# Patient Record
Sex: Female | Born: 1960 | Race: White | Hispanic: No | Marital: Single | Smoking: Never smoker
Health system: Southern US, Community
[De-identification: ages and names within clinical notes are randomized; demographics above are authoritative.]

## PROBLEM LIST (undated history)

## (undated) DIAGNOSIS — G43909 Migraine, unspecified, not intractable, without status migrainosus: Secondary | ICD-10-CM

## (undated) DIAGNOSIS — B019 Varicella without complication: Secondary | ICD-10-CM

## (undated) DIAGNOSIS — L661 Lichen planopilaris, unspecified: Secondary | ICD-10-CM

## (undated) DIAGNOSIS — N390 Urinary tract infection, site not specified: Secondary | ICD-10-CM

## (undated) HISTORY — DX: Lichen planopilaris: L66.1

## (undated) HISTORY — PX: TUBAL LIGATION: SHX77

## (undated) HISTORY — DX: Urinary tract infection, site not specified: N39.0

## (undated) HISTORY — DX: Lichen planopilaris, unspecified: L66.10

## (undated) HISTORY — PX: OTHER SURGICAL HISTORY: SHX169

## (undated) HISTORY — DX: Varicella without complication: B01.9

## (undated) HISTORY — PX: APPENDECTOMY: SHX54

---

## 1997-07-26 ENCOUNTER — Inpatient Hospital Stay (HOSPITAL_COMMUNITY): Admission: AD | Admit: 1997-07-26 | Discharge: 1997-07-29 | Payer: Self-pay | Admitting: Obstetrics and Gynecology

## 1998-09-13 ENCOUNTER — Other Ambulatory Visit: Admission: RE | Admit: 1998-09-13 | Discharge: 1998-09-13 | Payer: Self-pay | Admitting: Obstetrics and Gynecology

## 1999-10-03 ENCOUNTER — Other Ambulatory Visit: Admission: RE | Admit: 1999-10-03 | Discharge: 1999-10-03 | Payer: Self-pay | Admitting: Obstetrics and Gynecology

## 2000-06-04 ENCOUNTER — Inpatient Hospital Stay (HOSPITAL_COMMUNITY): Admission: AD | Admit: 2000-06-04 | Discharge: 2000-06-04 | Payer: Self-pay | Admitting: Obstetrics and Gynecology

## 2000-07-04 ENCOUNTER — Inpatient Hospital Stay (HOSPITAL_COMMUNITY): Admission: AD | Admit: 2000-07-04 | Discharge: 2000-07-04 | Payer: Self-pay | Admitting: Gynecology

## 2000-09-11 ENCOUNTER — Other Ambulatory Visit: Admission: RE | Admit: 2000-09-11 | Discharge: 2000-09-11 | Payer: Self-pay | Admitting: Obstetrics and Gynecology

## 2000-10-13 ENCOUNTER — Ambulatory Visit (HOSPITAL_COMMUNITY): Admission: RE | Admit: 2000-10-13 | Discharge: 2000-10-13 | Payer: Self-pay | Admitting: Obstetrics and Gynecology

## 2001-03-26 ENCOUNTER — Inpatient Hospital Stay (HOSPITAL_COMMUNITY): Admission: AD | Admit: 2001-03-26 | Discharge: 2001-03-28 | Payer: Self-pay | Admitting: Obstetrics and Gynecology

## 2001-04-03 ENCOUNTER — Encounter: Payer: Self-pay | Admitting: Obstetrics and Gynecology

## 2001-04-03 ENCOUNTER — Inpatient Hospital Stay (HOSPITAL_COMMUNITY): Admission: AD | Admit: 2001-04-03 | Discharge: 2001-04-03 | Payer: Self-pay | Admitting: Obstetrics and Gynecology

## 2001-04-07 ENCOUNTER — Encounter: Admission: RE | Admit: 2001-04-07 | Discharge: 2001-07-06 | Payer: Self-pay | Admitting: Internal Medicine

## 2001-05-05 ENCOUNTER — Other Ambulatory Visit: Admission: RE | Admit: 2001-05-05 | Discharge: 2001-05-05 | Payer: Self-pay | Admitting: Obstetrics and Gynecology

## 2001-09-06 ENCOUNTER — Emergency Department (HOSPITAL_COMMUNITY): Admission: EM | Admit: 2001-09-06 | Discharge: 2001-09-06 | Payer: Self-pay | Admitting: Emergency Medicine

## 2001-09-07 ENCOUNTER — Encounter: Payer: Self-pay | Admitting: Emergency Medicine

## 2002-05-24 ENCOUNTER — Other Ambulatory Visit: Admission: RE | Admit: 2002-05-24 | Discharge: 2002-05-24 | Payer: Self-pay | Admitting: Obstetrics and Gynecology

## 2002-07-12 ENCOUNTER — Encounter: Payer: Self-pay | Admitting: Internal Medicine

## 2002-07-12 ENCOUNTER — Inpatient Hospital Stay (HOSPITAL_COMMUNITY): Admission: EM | Admit: 2002-07-12 | Discharge: 2002-07-13 | Payer: Self-pay | Admitting: General Surgery

## 2002-07-12 ENCOUNTER — Encounter: Admission: RE | Admit: 2002-07-12 | Discharge: 2002-07-12 | Payer: Self-pay | Admitting: Internal Medicine

## 2002-07-12 ENCOUNTER — Encounter (INDEPENDENT_AMBULATORY_CARE_PROVIDER_SITE_OTHER): Payer: Self-pay | Admitting: Specialist

## 2003-01-21 ENCOUNTER — Ambulatory Visit (HOSPITAL_COMMUNITY): Admission: RE | Admit: 2003-01-21 | Discharge: 2003-01-21 | Payer: Self-pay | Admitting: Obstetrics and Gynecology

## 2003-06-01 ENCOUNTER — Other Ambulatory Visit: Admission: RE | Admit: 2003-06-01 | Discharge: 2003-06-01 | Payer: Self-pay | Admitting: Obstetrics and Gynecology

## 2004-07-04 ENCOUNTER — Other Ambulatory Visit: Admission: RE | Admit: 2004-07-04 | Discharge: 2004-07-04 | Payer: Self-pay | Admitting: Obstetrics and Gynecology

## 2005-01-18 ENCOUNTER — Encounter: Admission: RE | Admit: 2005-01-18 | Discharge: 2005-01-18 | Payer: Self-pay | Admitting: Internal Medicine

## 2005-03-06 ENCOUNTER — Encounter (INDEPENDENT_AMBULATORY_CARE_PROVIDER_SITE_OTHER): Payer: Self-pay | Admitting: Specialist

## 2005-03-06 ENCOUNTER — Ambulatory Visit (HOSPITAL_COMMUNITY): Admission: RE | Admit: 2005-03-06 | Discharge: 2005-03-06 | Payer: Self-pay | Admitting: Obstetrics and Gynecology

## 2008-01-01 ENCOUNTER — Encounter: Admission: RE | Admit: 2008-01-01 | Discharge: 2008-01-01 | Payer: Self-pay | Admitting: Internal Medicine

## 2008-07-18 ENCOUNTER — Encounter: Admission: RE | Admit: 2008-07-18 | Discharge: 2008-07-18 | Payer: Self-pay | Admitting: Internal Medicine

## 2010-05-07 ENCOUNTER — Other Ambulatory Visit: Payer: Self-pay | Admitting: Dermatology

## 2010-08-03 NOTE — H&P (Signed)
Alejandra Bowers, Alejandra Bowers                 ACCOUNT NO.:  [id number]   MEDICAL RECORD NO.:  [id number]          PATIENT TYPE:  AMB   LOCATION:  SDC                           FACILITY:  [facility]   PHYSICIAN:  Duke Salvia. Marcelle Overlie, M.D.DATE OF BIRTH:  [birth year]   DATE OF ADMISSION:  03/06/2005  DATE OF DISCHARGE:                                HISTORY & PHYSICAL   CHIEF COMPLAINT:  Menorrhagia.   HISTORY OF PRESENT ILLNESS:  A 50 year old, G3 P2, prior tubal, has had  significant problems with abnormal and heavy menstrual bleeding.  She  underwent a sonohysterogram, dated November 12, 2004, that showed adnexa  negative, no intracavitary mass noted.  There was one small _synechiae_  noted.  She presents now for [facility] with NovaSure EMA.  This procedure  including risk of bleeding, infection, adjacent organ injury, other  complications that may necessitate other surgery all reviewed with her, plus  the data for hypomenorrhea and amenorrhea on NovaSure review with her.   PAST MEDICAL HISTORY:   ALLERGIES:  1.  ERYTHROMYCIN.  2.  MACROBID.  3.  FLOXIN.  Marland Kitchen   CURRENT MEDICATIONS:  Imitrex p.r.n.   She has had a prior tubal ligation.   OBSTETRICAL HISTORY:  She has had two vaginal deliveries at term and  requires Clomid for ovulation induction.   FAMILY HISTORY:  Significant for mother who had hypothyroidism.  Otherwise  family history unremarkable.   PHYSICAL EXAMINATION:  VITAL SIGNS:  Temp 98.2, blood pressure 120/68.  HEENT:  Unremarkable.  NECK:  Supple without masses.  LUNGS:  Clear.  CARDIOVASCULAR:  Regular rate and rhythm without murmurs, rubs, or gallops  noted.  BREASTS:  Without masses.  ABDOMEN:  Soft, flat, nontender.  PELVIC:  Normal external genitalia.  Vagina cervix clear.  Uterus upper  limit of normal size.  There was mild prolapse noted.  Adnexa negative.  EXTREMITIES:  Unremarkable.  NEUROLOGIC:  Unremarkable.   IMPRESSION:  Menorrhagia.   PLAN:  D&C, NovaSure EMS.   Procedure and risks were reviewed as above.      Richard M. Marcelle Overlie, M.D.  Electronically Signed     RMH/MEDQ  D:  03/06/2005  T:  03/06/2005  Job:  [id number]

## 2010-08-03 NOTE — H&P (Signed)
   [name], Alejandra Bowers                           ACCOUNT NO.:  [id number]   MEDICAL RECORD NO.:  [id number]                   PATIENT TYPE:  AMB   LOCATION:  SDC                                  [facility]ILITY[facility]  [facility]   PHYSICIAN:  Alejandra Bowers, M.D.            DATE OF BIRTH:  Mar 13, 1961   DATE OF ADMISSION:  01/21/2003  DATE OF DISCHARGE:                                HISTORY & PHYSICAL   CHIEF COMPLAINT:  Requests permanent sterilization.   HISTORY OF PRESENT ILLNESS:  [age]-year-old G3 P2, currently on  Loestrin for contraception presents for tubal ligation.  This procedure  including risk of bleeding, infection, adjacent organ injury, the possible  need for open or additional surgery reviewed with her.  The permanence of  the procedure and failure rate of 2 to 3 per 1000 discussed also.  She  remains firm in her decision to proceed with sterilization.   PAST MEDICAL HISTORY:   ALLERGIES:  1. ERYTHROMYCIN.  2. MACROBID.  3. FLOXIN.  4. EGGS.   OPERATIONS:  D&C with removal of endometrial polyps; two vaginal deliveries.   FAMILY HISTORY:  Mother had a thyroidectomy, currently on Synthroid.   CURRENT MEDICATION:  Loestrin.   PHYSICAL EXAMINATION:  VITAL SIGNS:  Temperature 98.2, blood pressure  130/60.  HEENT:  Unremarkable.  NECK:  Supple without masses.  LUNGS:  Clear.  CARDIOVASCULAR:  Regular rate and rhythm without murmurs, rubs, gallops  noted.  BREASTS:  Without masses.  ABDOMEN:  Soft, flat, nontender.  PELVIC:  Normal external genitalia.  Vagina and cervix clear.  Uterus  midposition, normal size.  Adnexa negative.  EXTREMITIES/NEUROLOGIC:  Exams unremarkable.   IMPRESSION:  For tubal ligation by Filshie clip application.  Procedure and  risk reviewed as above.   PLAN:  Tubal ligation by Filshie clip application scheduled as an  outpatient.                                               Alejandra Bowers, M.D.   RMH/MEDQ  D:  01/11/2003  T:   01/11/2003  Job:  [id number]

## 2010-08-03 NOTE — Op Note (Signed)
NAMECHARLIEGH, Alejandra Bowers                           ACCOUNT NO.:  [id number]   MEDICAL RECORD NO.:  [id number]                   PATIENT TYPE:  INP   LOCATION:  0447                                 FACILITY:  [facility]   PHYSICIAN:  Sharlet Salina T. Hoxworth, M.D.          DATE OF BIRTH:  10/22/1960   DATE OF PROCEDURE:  07/12/2002  DATE OF DISCHARGE:                                 OPERATIVE REPORT   PREOPERATIVE DIAGNOSIS:  Acute appendicitis.   POSTOPERATIVE DIAGNOSIS:  Acute appendicitis.   SURGICAL PROCEDURE:  Laparoscopic appendectomy.   SURGEON:  Lorne Skeens. Hoxworth, M.D.   ANESTHESIA:  General.   BRIEF HISTORY:  The patient is a 50 year old white female who presents with  two days of progressive but diffuse mid right lower quadrant abdominal pain,  and has localized tenderness in the right lower quadrant.  A CT scan was  obtained today, which shows changes consistent with acute appendicitis.  Laparoscopic appendectomy has been recommended and accepted.   The nature of the procedure, its indications, risks of bleeding and  infection were discussed and approved preoperatively.  She now presents to  the operating room for this procedure.   DESCRIPTION OF PROCEDURE:  The patient went to operating room and placed in  supine position on the operating room table.  General endotracheal  anesthesia was induced.  The abdomen was sterilely prepped and draped.  She  had received preoperative antibiotics.  Local anesthesia was used to inject  the trocar sites prior to the incisions.  A 1 cm incision made at the  umbilicus and dissection carried down to the midline fascia, which was  sharply incised from 1 cm.  The peritoneum was entered under direct vision.  A pair of mattress sutures with 0 Vicryl made; the Hasson trocar was placed  and pneumoperitoneum established.  Under direct vision, a 5 mm trocar was  placed in the right upper quadrant, and a 12 mm trocar placed in the left  lower  quadrant.  The appendix was visualized, lying just inferior to the  cecum; showed evidence of acute inflammation and thickening, though no  gangrene, perforation or obvious purulence.  The appendix was elevated and  lateral peritoneal attachments divided, freeing the appendix and tip of the  cecum.  The mesoappendix was isolated and was divided with the harmonic  scalpel, completely freeing the appendix down to its base.  The appendix was  then divided with the firing of the Endo GIA 3.5 mm stapler.  The appendix  was placed in the Endo catch bag and brought out through the umbilicus.  There was some slight bleeding along the staple line, controlled with two  clips.  The right lower quadrant was irrigated, suctioned and hemostasis was  assured.  Trocars were removed under direct vision.  All CO2 evacuated from  the peritoneal cavity.  The mattress suture was secured at the umbilicus.  Skin incisions were  closed with interrupted subcuticular 4-0 Monocryl and  Steri-Strips.  Sponge, needle and instrument counts were correct.  Dry  sterile dressings were applied and the patient taken to the recovery room in  good condition.                                               Lorne Skeens. Hoxworth, M.D.   Tory Emerald  D:  07/12/2002  T:  07/13/2002  Job:  [id number]

## 2010-08-03 NOTE — Op Note (Signed)
Alejandra Bowers, Alejandra Bowers                           ACCOUNT NO.:  [id number]   MEDICAL RECORD NO.:  [id number]                   PATIENT TYPE:  AMB   LOCATION:  SDC                                  [facility]ACILITY:  [facility]   PHYSICIAN:  Duke Salvia. Marcelle Overlie, M.D.            DATE OF BIRTH:  1960/09/17   DATE OF PROCEDURE:  01/21/2003  DATE OF DISCHARGE:                                 OPERATIVE REPORT   PREOPERATIVE DIAGNOSIS:  Requests permanent sterilization.   POSTOPERATIVE DIAGNOSIS:  Requests permanent sterilization.   PROCEDURE:  Laparoscopic tubal ligation by Filshie clip application.   SURGEON:  Duke Salvia. Marcelle Overlie, M.D.   ANESTHESIA:  General endotracheal.   COMPLICATIONS:  None.   DRAINS:  In-and-out Foley catheter.   ESTIMATED BLOOD LOSS:  Less than 5 mL.   PROCEDURE AND FINDINGS:  The patient had been taken to the operating room.  After an adequate level of general endotracheal anesthesia was obtained with  the patient's legs in stirrups, the abdomen, perineum, and vagina were  prepped and draped in the usual manner for a laparoscopy.  The bladder was  drained, a UA carried out, and the uterus was anterior, small, mobile,  adnexa negative.  A Hulka tenaculum was positioned.  Attention directed to  the abdomen, where the subumbilical area was infiltrated with 0.5% Marcaine  plain.  A small incision was made and the Veress needle was introduced  without difficulty.  Its intra-abdominal position was verified by pressure  and water testing.  After a 2.5 L pneumoperitoneum was then created, a  laparoscopic trocar and sleeve were then introduced without difficulty.  There was no evidence of any bleeding or trauma.  With the patient in  Trendelenburg and the uterus anteflexed, pelvic findings were observed and  noted to be normal.  A solitary surgical staple was agglutinated to the  surface of the left ovary, not causing any adhesions.  Presumably this had  migrated after her  appendectomy.  No other abnormalities noted.  Marcaine  0.5% plain 3-4 mL were then dripped across each tube from the cornu to the  fimbriated end.  The Filshie clip applicator was then backloaded, the  Filshie clip was applied at a right angle 2 cm from the cornu on either  side, completely engulfing the tube at a right angle with excellent  application.  This was repeated on both sides after carefully identifying  the tubes.  She tolerated this well.  Instruments were removed, gas was  allowed to escape.  The defects closed with 4-0 Dexon subcuticular sutures,  and she went to the recovery room in good condition.                                               Richard M.  Marcelle Overlie, M.D.    RMH/MEDQ  D:  01/21/2003  T:  01/21/2003  Job:  [id number]

## 2010-08-03 NOTE — Op Note (Signed)
NAMEALLYNA, PITTSLEY                 ACCOUNT NO.:  [id number]   MEDICAL RECORD NO.:  [id number]          PATIENT TYPE:  AMB   LOCATION:  SDC                           FACILITY:  [facility]   PHYSICIAN:  Duke Salvia. Marcelle Overlie, M.D.DATE OF BIRTH:  01/24/1961   DATE OF PROCEDURE:  03/06/2005  DATE OF DISCHARGE:                                 OPERATIVE REPORT   PREOPERATIVE DIAGNOSIS:  Menorrhagia.   POSTOPERATIVE DIAGNOSIS:  Menorrhagia.   PROCEDURE:  D&C, NovaSure EMA.   SURGEON:  Duke Salvia. Marcelle Overlie, M.D.   ANESTHESIA:  General.   COMPLICATIONS:  None.   DRAINS:  In-and-out Foley catheter.   BLOOD LOSS:  Minimal.   SPECIMEN:  Endometrial curettings.   DESCRIPTION OF PROCEDURE:  The patient was taken to the operating room and  after an adequate level of general anesthesia was obtained with the  patient's legs in stirrups. The perineum and vagina prepped and draped in  usual manner for vaginal procedure. Bladder was drained, EUA carried out.  Uterus was posterior. There was mild degree of prolapse. The uterus  otherwise normal size. Adnexa negative. Speculum was positioned,  paracervical block created by infiltrating at 3 and 9 o'clock submucosally 5  to 7 mL of 1% Xylocaine on either side after negative aspiration. The uterus  was then sounded to 11 cm with the cervical length of 3.5 and was dilated to  a 27-29 Pratt dilator. A D&C was carried out to mainly send endometrial  biopsy. Minimal tissue was removed. The NovaSure was inserted per protocol  with a sounding length of 11, cervical length 3.5. Passed the CO2 test and  power of 161 for 43 seconds without any immediate complication. She  tolerated this well and went to recovery room in good condition.      Richard M. Marcelle Overlie, M.D.  Electronically Signed     RMH/MEDQ  D:  03/06/2005  T:  03/07/2005  Job:  [id number]

## 2010-08-03 NOTE — H&P (Signed)
Alejandra Bowers, Alejandra Bowers                           ACCOUNT NO.:  [id number]   MEDICAL RECORD NO.:  [id number]                   PATIENT TYPE:  INP   LOCATION:  0447                                 FACILITY:  [facility]   PHYSICIAN:  Sharlet Salina T. Hoxworth, M.D.          DATE OF BIRTH:  1960/04/29   DATE OF ADMISSION:  07/12/2002  DATE OF DISCHARGE:                                HISTORY & PHYSICAL   CHIEF COMPLAINT:  Right lower quadrant abdominal pain.   HISTORY OF PRESENT ILLNESS:  The patient is a 50 year old white female who  about 48 hours ago developed initially diffuse mild to moderate abdominal  pain.  Over the past 24 hours it has become more severe and localized to the  right lower quadrant.  She states that it is worse with any motion and is  better with rest.  She has not had any fever, chills, nausea or vomiting.  Bowel movement yesterday was normal.  No GU symptoms.  She was evaluated  yesterday at the [facility] and did have a mildly elevated white  count.  She was stressed to return today for followup, and there was concern  over significant right lower quadrant tenderness and a CT scan of the  abdomen was obtained.  She denies any previous significant chronic GI  complaints or abdominal pain.   PAST MEDICAL HISTORY:  D&C.  Otherwise, entirely negative for medical and  surgical illnesses.   MEDICATIONS:  She is on no current medications.   ALLERGIES:  1. MACROBID.  2. FLOXIN.   SOCIAL HISTORY:  Married, two children.  Does not smoke cigarettes or drink  alcohol.   FAMILY HISTORY:  Noncontributory.   REVIEW OF SYMPTOMS:  GENERAL:  No fever, chills, weight change.  RESPIRATORY:  No wheezing, shortness of breath, or cough.  CARDIAC:  No  palpitations or heart murmur.  ABDOMEN:  As above.  GENITOURINARY:  No  frequency, burning, blood.  HEMATOLOGIC:  No history of blood clots or  abnormal bleeding.   PHYSICAL EXAMINATION:  VITAL SIGNS:  Temperature is 98,  pulse is 86 and  regular, respirations 20, blood pressure 108/70.  GENERAL:  She is a well-developed white female in no acute distress.  SKIN:  Warm and dry without rash or infection.  HEENT:  No palpable thyromegaly or masses.  Sclerae nonicteric.  LUNGS:  Clear to auscultation.  CARDIAC:  Regular rate and rhythm without murmurs.  No JVD or edema.  ABDOMEN:  Bowel sounds are present.  There is very significant well  localized right lower quadrant tenderness with guarding.  No palpable masses  or hepatosplenomegaly.  EXTREMITIES:  Without deformity or edema.  NEUROLOGIC:  Alert and oriented x4.  Motor and sensory examination grossly  normal.   LABORATORY DATA:  Obtained from [facility] shows urinalysis with  2+ leukocytes.  Liver function tests and amylase normal.  Hemoglobin 14.3,  white blood cell count 6.8.   CT scan of the abdomen was performed today at [facility], and shows thickening of  the appendix consistent with acute appendicitis without complication.   ASSESSMENT AND PLAN:  Right lower quadrant abdominal pain, very significant  tenderness on examination, and CT scan with evidence of acute appendicitis.  I have recommended proceeding with laparoscopic appendectomy, and the  patient is admitted for this procedure.                                                Lorne Skeens. Hoxworth, M.D.    Tory Emerald  D:  07/12/2002  T:  07/13/2002  Job:  [id number]

## 2011-03-22 ENCOUNTER — Ambulatory Visit: Payer: Self-pay

## 2012-06-08 ENCOUNTER — Other Ambulatory Visit: Payer: Self-pay | Admitting: Internal Medicine

## 2012-06-08 DIAGNOSIS — R1011 Right upper quadrant pain: Secondary | ICD-10-CM

## 2012-06-09 ENCOUNTER — Ambulatory Visit
Admission: RE | Admit: 2012-06-09 | Discharge: 2012-06-09 | Disposition: A | Discharge status: Home / Self Care | Payer: Managed Care, Other (non HMO) | Source: Ambulatory Visit | Attending: Internal Medicine | Admitting: Internal Medicine

## 2012-06-09 DIAGNOSIS — R1011 Right upper quadrant pain: Secondary | ICD-10-CM

## 2013-11-15 ENCOUNTER — Other Ambulatory Visit: Payer: Self-pay | Admitting: Obstetrics and Gynecology

## 2013-11-16 LAB — CYTOLOGY - PAP

## 2014-11-23 ENCOUNTER — Other Ambulatory Visit: Payer: Self-pay | Admitting: Obstetrics and Gynecology

## 2014-11-24 LAB — CYTOLOGY - PAP

## 2016-04-19 ENCOUNTER — Ambulatory Visit (INDEPENDENT_AMBULATORY_CARE_PROVIDER_SITE_OTHER): Payer: BLUE CROSS/BLUE SHIELD | Admitting: Podiatry

## 2016-04-19 ENCOUNTER — Ambulatory Visit (INDEPENDENT_AMBULATORY_CARE_PROVIDER_SITE_OTHER): Payer: BLUE CROSS/BLUE SHIELD

## 2016-04-19 ENCOUNTER — Encounter: Payer: Self-pay | Admitting: Podiatry

## 2016-04-19 VITALS — Resp 16 | Ht 62.0 in | Wt 180.0 lb

## 2016-04-19 DIAGNOSIS — M722 Plantar fascial fibromatosis: Secondary | ICD-10-CM | POA: Diagnosis not present

## 2016-04-19 MED ORDER — TRIAMCINOLONE ACETONIDE 10 MG/ML IJ SUSP
10.0000 mg | Freq: Once | INTRAMUSCULAR | Status: AC
  Administered 2016-04-19: 10 mg

## 2016-04-19 MED ORDER — DICLOFENAC SODIUM 75 MG PO TBEC: 75.0000 mg | DELAYED_RELEASE_TABLET | Freq: Two times a day (BID) | ORAL | 2 refills | Status: DC

## 2016-04-19 NOTE — Progress Notes (Signed)
   Subjective:    Patient ID: Alejandra Bowers, female    DOB: 30-Sep-1960, 56 y.o.   MRN: [id number]  HPI  Chief Complaint  Patient presents with  . Foot Pain    Left; Bottom of heel and lateral side x 1 year but has got worse over the past month.        Review of Systems     Objective:   Physical Exam        Assessment & Plan:

## 2016-04-19 NOTE — Patient Instructions (Signed)

## 2016-04-20 NOTE — Progress Notes (Signed)
Subjective:     Patient ID: Alejandra Bowers, female   DOB: [birth year], 56 y.o.   MRN: [id number]  HPI patient states that she has a lot of pain in her left heel and it's making it difficult for her to walk and it's been present for at least 3 months and she's tried wider shoes and over-the-counter insoles   Review of Systems  All other systems reviewed and are negative.      Objective:   Physical Exam  Constitutional: She is oriented to person, place, and time.  Cardiovascular: Intact distal pulses.   Musculoskeletal: Normal range of motion.  Neurological: She is oriented to person, place, and time.  Skin: Skin is warm.  Nursing note and vitals reviewed.  neurovascular status intact muscle strength adequate range of motion within normal limits with patient found to have exquisite discomfort plantar aspect left heel the insertional point tendon the calcaneus with inflammation fluid buildup around the medial band with moderate depression of the arch     Assessment:     Severe plantar fasciitis left heel acute in nature    Plan:     H&P condition reviewed and injection of the plantar fascial left accomplished 3 mg Kenalog 5 mill grams Xylocaine and applied fascial brace placed on diclofenac 75 mg twice a day and gave instructions for possible long-term orthotics. Patient will utilize physical therapy and be seen back in several weeks  X-rays indicate spur formation with no indications of stress fracture

## 2016-05-03 ENCOUNTER — Ambulatory Visit: Payer: BLUE CROSS/BLUE SHIELD | Admitting: Podiatry

## 2016-05-08 ENCOUNTER — Encounter: Payer: Self-pay | Admitting: Podiatry

## 2016-05-08 ENCOUNTER — Ambulatory Visit (INDEPENDENT_AMBULATORY_CARE_PROVIDER_SITE_OTHER): Payer: BLUE CROSS/BLUE SHIELD | Admitting: Podiatry

## 2016-05-08 DIAGNOSIS — M722 Plantar fascial fibromatosis: Secondary | ICD-10-CM | POA: Diagnosis not present

## 2016-05-08 NOTE — Progress Notes (Signed)
Subjective:     Patient ID: Galen Manilaammy S Kitamura, female   DOB: 1960-06-14, 56 y.o.   MRN: [id number]  HPI patient presents stating I'm still getting discomfort in my left heel and it's making it hard for me to walk comfortably. Patient states it some better than previous but still present   Review of Systems     Objective:   Physical Exam Neurovascular status intact with discomfort plantar heel left with continued depression of the arch upon weightbearing with mechanical dysfunction noted    Assessment:     Plantar fasciitis related to structure of the arch creating problems    Plan:     H&P condition reviewed and recommended long-term orthotics to control motion and scanned for customized orthotic devices. I then went ahead and discussed continued anti-inflammatory activity and will be seen back when orthotics ready or earlier if needed and discussed physical therapy and shoe gear modifications

## 2016-06-05 ENCOUNTER — Ambulatory Visit: Payer: BLUE CROSS/BLUE SHIELD

## 2016-06-06 ENCOUNTER — Ambulatory Visit (INDEPENDENT_AMBULATORY_CARE_PROVIDER_SITE_OTHER): Payer: Self-pay | Admitting: Podiatry

## 2016-06-06 DIAGNOSIS — M722 Plantar fascial fibromatosis: Secondary | ICD-10-CM

## 2016-06-06 NOTE — Patient Instructions (Signed)

## 2016-06-06 NOTE — Progress Notes (Signed)
Patient presents for orthotic pick up.  Verbal and written break in and wear instructions given.  Patient will follow up in 4 weeks if symptoms worsen or fail to improve. 

## 2017-12-01 ENCOUNTER — Other Ambulatory Visit: Payer: Self-pay

## 2017-12-01 ENCOUNTER — Encounter: Payer: Self-pay | Admitting: Emergency Medicine

## 2017-12-01 ENCOUNTER — Ambulatory Visit
Admission: EM | Admit: 2017-12-01 | Discharge: 2017-12-01 | Disposition: A | Discharge status: Home / Self Care | Payer: PRIVATE HEALTH INSURANCE | Attending: Family Medicine | Admitting: Family Medicine

## 2017-12-01 DIAGNOSIS — N390 Urinary tract infection, site not specified: Secondary | ICD-10-CM | POA: Diagnosis not present

## 2017-12-01 DIAGNOSIS — R319 Hematuria, unspecified: Secondary | ICD-10-CM | POA: Diagnosis not present

## 2017-12-01 HISTORY — DX: Migraine, unspecified, not intractable, without status migrainosus: G43.909

## 2017-12-01 LAB — URINALYSIS, COMPLETE (UACMP) WITH MICROSCOPIC
BILIRUBIN URINE: NEGATIVE
Glucose, UA: NEGATIVE mg/dL
KETONES UR: NEGATIVE mg/dL
Nitrite: NEGATIVE
PH: 6.5 (ref 5.0–8.0)
Protein, ur: NEGATIVE mg/dL
Specific Gravity, Urine: 1.01 (ref 1.005–1.030)

## 2017-12-01 MED ORDER — CEPHALEXIN 500 MG PO CAPS: 500.0000 mg | ORAL_CAPSULE | Freq: Two times a day (BID) | ORAL | 0 refills | Status: DC

## 2017-12-01 NOTE — ED Triage Notes (Signed)
Pt c/o urinary urgency, pressure/ pelvic pain, and.urine odor. Started about about a week ago.

## 2017-12-01 NOTE — ED Provider Notes (Signed)
[facility]    CSN: [id number] Arrival date & time: 12/01/17  1833     History   Chief Complaint Chief Complaint  Patient presents with  . Urinary Tract Infection    HPI Alejandra Bowers is a 57 y.o. female.   The history is provided by the patient.  Urinary Tract Infection  Associated symptoms: no abdominal pain, no fever, no flank pain, no genital lesions, no nausea, no vaginal discharge and no vomiting   Dysuria  Pain quality:  Aching and burning Pain severity:  Moderate Onset quality:  Sudden Duration:  1 week Timing:  Constant Progression:  Worsening Chronicity:  New Recent urinary tract infections: no   Relieved by:  None tried Ineffective treatments:  None tried Urinary symptoms: foul-smelling urine and frequent urination   Associated symptoms: no abdominal pain, no fever, no flank pain, no genital lesions, no nausea, no vaginal discharge and no vomiting   Risk factors: no hx of pyelonephritis, no hx of urolithiasis, no kidney transplant, not pregnant, no recurrent urinary tract infections, no renal cysts, no renal disease, no single kidney and no urinary catheter     Past Medical History:  Diagnosis Date  . Migraines     There are no active problems to display for this patient.   Past Surgical History:  Procedure Laterality Date  . APPENDECTOMY    . TUBAL LIGATION      OB History   None      Home Medications    Prior to Admission medications   Medication Sig Start Date End Date Taking? Authorizing Provider  doxycycline (VIBRA-TABS) 100 MG tablet  11/15/17  Yes [provider]  finasteride (PROSCAR) 5 MG tablet TAKE 1/2 TO 1 TABLET BY MOUTH DAILY AS DIRECTED 04/10/16  Yes [provider]  meloxicam (MOBIC) 15 MG tablet  11/26/17  Yes [provider]  SUMAtriptan (IMITREX) 100 MG tablet TAKE 1 TABLET BY MOUTH AS NEEDED TWICE A DAY 02/06/16  Yes [provider]  cephALEXin (KEFLEX) 500 MG capsule Take 1  capsule (500 mg total) by mouth 2 (two) times daily. 12/01/17   Payton Mccallum, MD  diclofenac (VOLTAREN) 75 MG EC tablet Take 1 tablet (75 mg total) by mouth 2 (two) times daily. 04/19/16   Lenn Sink, DPM  FINACEA 15 % FOAM APPLY 1 GM ON THE SKIN TWICE A DAY 04/10/16   [provider]  zolpidem (AMBIEN) 5 MG tablet Take 5 mg by mouth at bedtime. 03/20/16   [provider]    Family History Family History  Problem Relation Age of Onset  . Rheum arthritis Mother   . Cancer Mother   . Cancer Father     Social History Social History   Tobacco Use  . Smoking status: Never Smoker  . Smokeless tobacco: Never Used  Substance Use Topics  . Alcohol use: Yes    Comment: 1 beer a month  . Drug use: Never     Allergies   Macrobid [nitrofurantoin]   Review of Systems Review of Systems  Constitutional: Negative for fever.  Gastrointestinal: Negative for abdominal pain, nausea and vomiting.  Genitourinary: Positive for dysuria. Negative for flank pain and vaginal discharge.     Physical Exam Triage Vital Signs ED Triage Vitals  Enc Vitals Group     BP 12/01/17 1851 127/84     Pulse Rate 12/01/17 1851 85     Resp 12/01/17 1851 17     Temp 12/01/17 1851  97.7 F (36.5 C)     Temp Source 12/01/17 1851 Oral     SpO2 12/01/17 1851 100 %     Weight 12/01/17 1849 190 lb (86.2 kg)     Height 12/01/17 1849 5\' 2"  (1.575 m)     Head Circumference --      Peak Flow --      Pain Score 12/01/17 1849 5     Pain Loc --      Pain Edu? --      Excl. in GC? --    No data found.  Updated Vital Signs BP 127/84 (BP Location: Left Arm)   Pulse 85   Temp 97.7 F (36.5 C) (Oral)   Resp 17   Ht 5\' 2"  (1.575 m)   Wt 86.2 kg   SpO2 100%   BMI 34.75 kg/m   Visual Acuity Right Eye Distance:   Left Eye Distance:   Bilateral Distance:    Right Eye Near:   Left Eye Near:    Bilateral Near:     Physical Exam  Constitutional: She appears well-developed and  well-nourished. No distress.  Abdominal: Soft. She exhibits no distension. There is no tenderness. There is no guarding.  Skin: She is not diaphoretic.  Nursing note and vitals reviewed.    UC Treatments / Results  Labs (all labs ordered are listed, but only abnormal results are displayed) Labs Reviewed  URINALYSIS, COMPLETE (UACMP) WITH MICROSCOPIC - Abnormal; Notable for the following components:      Result Value   Hgb urine dipstick SMALL (*)    Leukocytes, UA MODERATE (*)    Bacteria, UA MANY (*)    All other components within normal limits  URINE CULTURE    EKG None  Radiology No results found.  Procedures Procedures (including critical care time)  Medications Ordered in UC Medications - No data to display  Initial Impression / Assessment and Plan / UC Course  I have reviewed the triage vital signs and the nursing notes.  Pertinent labs & imaging results that were available during my care of the patient were reviewed by me and considered in my medical decision making (see chart for details).      Final Clinical Impressions(s) / UC Diagnoses   Final diagnoses:  Urinary tract infection with hematuria, site unspecified    ED Prescriptions    Medication Sig Dispense Auth. Provider   cephALEXin (KEFLEX) 500 MG capsule Take 1 capsule (500 mg total) by mouth 2 (two) times daily. 14 capsule January Bergthold, Pamala Hurryrlando, MD     1. lab results and diagnosis reviewed with patient 2. rx as per orders above; reviewed possible side effects, interactions, risks and benefits  3. Recommend supportive treatment with increased fluids  4. Follow-up prn if symptoms worsen or don't improve  Controlled Substance Prescriptions [place] Controlled Substance Registry consulted? Not Applicable   Payton Mccallumonty, Kitrina Maurin, MD 12/01/17 [phone]

## 2017-12-04 ENCOUNTER — Telehealth (HOSPITAL_COMMUNITY): Payer: Self-pay

## 2017-12-04 LAB — URINE CULTURE: Special Requests: NORMAL

## 2017-12-04 NOTE — Telephone Encounter (Signed)
Urine culture positive for kebsiella pneumoniae. This was treated with keflex at [facility] visit. Pt called and made aware.

## 2019-11-01 ENCOUNTER — Ambulatory Visit: Payer: PRIVATE HEALTH INSURANCE | Admitting: Nurse Practitioner

## 2019-11-01 ENCOUNTER — Encounter: Payer: Self-pay | Admitting: Nurse Practitioner

## 2019-11-01 ENCOUNTER — Other Ambulatory Visit: Payer: Self-pay

## 2019-11-01 VITALS — BP 106/70 | HR 80 | Temp 97.6°F | Ht 61.0 in | Wt 163.0 lb

## 2019-11-01 DIAGNOSIS — E785 Hyperlipidemia, unspecified: Secondary | ICD-10-CM | POA: Diagnosis not present

## 2019-11-01 DIAGNOSIS — E669 Obesity, unspecified: Secondary | ICD-10-CM | POA: Diagnosis not present

## 2019-11-01 DIAGNOSIS — E538 Deficiency of other specified B group vitamins: Secondary | ICD-10-CM | POA: Diagnosis not present

## 2019-11-01 DIAGNOSIS — Z0000 Encounter for general adult medical examination without abnormal findings: Secondary | ICD-10-CM | POA: Diagnosis not present

## 2019-11-01 DIAGNOSIS — G43709 Chronic migraine without aura, not intractable, without status migrainosus: Secondary | ICD-10-CM

## 2019-11-01 DIAGNOSIS — Z6830 Body mass index (BMI) 30.0-30.9, adult: Secondary | ICD-10-CM | POA: Diagnosis not present

## 2019-11-01 DIAGNOSIS — Z23 Encounter for immunization: Secondary | ICD-10-CM | POA: Diagnosis not present

## 2019-11-01 DIAGNOSIS — R7989 Other specified abnormal findings of blood chemistry: Secondary | ICD-10-CM | POA: Diagnosis not present

## 2019-11-01 LAB — CBC WITH DIFFERENTIAL/PLATELET
Basophils Absolute: 0 10*3/uL (ref 0.0–0.1)
Basophils Relative: 0.4 % (ref 0.0–3.0)
Eosinophils Absolute: 0.2 10*3/uL (ref 0.0–0.7)
Eosinophils Relative: 2.3 % (ref 0.0–5.0)
HCT: 43.7 % (ref 36.0–46.0)
Hemoglobin: 14.8 g/dL (ref 12.0–15.0)
Lymphocytes Relative: 30.2 % (ref 12.0–46.0)
Lymphs Abs: 2.1 10*3/uL (ref 0.7–4.0)
MCHC: 33.9 g/dL (ref 30.0–36.0)
MCV: 87.6 fl (ref 78.0–100.0)
Monocytes Absolute: 0.4 10*3/uL (ref 0.1–1.0)
Monocytes Relative: 5.6 % (ref 3.0–12.0)
Neutro Abs: 4.3 10*3/uL (ref 1.4–7.7)
Neutrophils Relative %: 61.5 % (ref 43.0–77.0)
Platelets: 222 10*3/uL (ref 150.0–400.0)
RBC: 4.99 Mil/uL (ref 3.87–5.11)
RDW: 14.9 % (ref 11.5–15.5)
WBC: 7 10*3/uL (ref 4.0–10.5)

## 2019-11-01 LAB — HEMOGLOBIN A1C: Hgb A1c MFr Bld: 5.6 % (ref 4.6–6.5)

## 2019-11-01 LAB — LIPID PANEL
Cholesterol: 237 mg/dL — ABNORMAL HIGH (ref 0–200)
HDL: 81.4 mg/dL (ref >39.00)
NonHDL: 155.94
Total CHOL/HDL Ratio: 3
Triglycerides: 274 mg/dL — ABNORMAL HIGH (ref 0.0–149.0)
VLDL: 54.8 mg/dL — ABNORMAL HIGH (ref 0.0–40.0)

## 2019-11-01 LAB — COMPREHENSIVE METABOLIC PANEL
ALT: 13 U/L (ref 0–35)
AST: 16 U/L (ref 0–37)
Albumin: 4.4 g/dL (ref 3.5–5.2)
Alkaline Phosphatase: 61 U/L (ref 39–117)
BUN: 12 mg/dL (ref 6–23)
CO2: 24 mEq/L (ref 19–32)
Calcium: 9.5 mg/dL (ref 8.4–10.5)
Chloride: 101 mEq/L (ref 96–112)
Creatinine, Ser: 0.72 mg/dL (ref 0.40–1.20)
GFR: 83.47 mL/min (ref >60.00)
Glucose, Bld: 85 mg/dL (ref 70–99)
Potassium: 3.8 mEq/L (ref 3.5–5.1)
Sodium: 136 mEq/L (ref 135–145)
Total Bilirubin: 0.5 mg/dL (ref 0.2–1.2)
Total Protein: 6.4 g/dL (ref 6.0–8.3)

## 2019-11-01 LAB — LDL CHOLESTEROL, DIRECT: Direct LDL: 114 mg/dL

## 2019-11-01 LAB — B12 AND FOLATE PANEL
Folate: 6.2 ng/mL (ref >5.9)
Vitamin B-12: 1244 pg/mL — ABNORMAL HIGH (ref 211–911)

## 2019-11-01 LAB — TSH: TSH: 2.25 u[IU]/mL (ref 0.35–4.50)

## 2019-11-01 LAB — VITAMIN D 25 HYDROXY (VIT D DEFICIENCY, FRACTURES): VITD: 34.64 ng/mL (ref 30.00–100.00)

## 2019-11-01 NOTE — Patient Instructions (Addendum)
Please go to the lab today.  We will call you when results are in.  Please sign medical release from Dr. Laurann Montana  Proceed with Dr. Matthew Saras  for her GYN care as you plan  Continue to follow with your dermatologist as needed.  Congratulations on your profound weight loss!  Continue with your healthy journey.  You were going to check on the last tetanus shot.   Begin shingles vaccine series she would like to have a today.     Migraine Headache A migraine headache is an intense, throbbing pain on one side or both sides of the head. Migraine headaches may also cause other symptoms, such as nausea, vomiting, and sensitivity to light and noise. A migraine headache can last from 4 hours to 3 days. Talk with your doctor about what things may bring on (trigger) your migraine headaches. What are the causes? The exact cause of this condition is not known. However, a migraine may be caused when nerves in the brain become irritated and release chemicals that cause inflammation of blood vessels. This inflammation causes pain. This condition may be triggered or caused by:  Drinking alcohol.  Smoking.  Taking medicines, such as: ? Medicine used to treat chest pain (nitroglycerin). ? Birth control pills. ? Estrogen. ? Certain blood pressure medicines.  Eating or drinking products that contain nitrates, glutamate, aspartame, or tyramine. Aged cheeses, chocolate, or caffeine may also be triggers.  Doing physical activity. Other things that may trigger a migraine headache include:  Menstruation.  Pregnancy.  Hunger.  Stress.  Lack of sleep or too much sleep.  Weather changes.  Fatigue. What increases the risk? The following factors may make you more likely to experience migraine headaches:  Being a certain age. This condition is more common in people who are 79-40 years old.  Being female.  Having a family history of migraine headaches.  Being Caucasian.  Having a mental  health condition, such as depression or anxiety.  Being obese. What are the signs or symptoms? The main symptom of this condition is pulsating or throbbing pain. This pain may:  Happen in any area of the head, such as on one side or both sides.  Interfere with daily activities.  Get worse with physical activity.  Get worse with exposure to bright lights or loud noises. Other symptoms may include:  Nausea.  Vomiting.  Dizziness.  General sensitivity to bright lights, loud noises, or smells. Before you get a migraine headache, you may get warning signs (an aura). An aura may include:  Seeing flashing lights or having blind spots.  Seeing bright spots, halos, or zigzag lines.  Having tunnel vision or blurred vision.  Having numbness or a tingling feeling.  Having trouble talking.  Having muscle weakness. Some people have symptoms after a migraine headache (postdromal phase), such as:  Feeling tired.  Difficulty concentrating. How is this diagnosed? A migraine headache can be diagnosed based on:  Your symptoms.  A physical exam.  Tests, such as: ? CT scan or an MRI of the head. These imaging tests can help rule out other causes of headaches. ? Taking fluid from the spine (lumbar puncture) and analyzing it (cerebrospinal fluid analysis, or CSF analysis). How is this treated? This condition may be treated with medicines that:  Relieve pain.  Relieve nausea.  Prevent migraine headaches. Treatment for this condition may also include:  Acupuncture.  Lifestyle changes like avoiding foods that trigger migraine headaches.  Biofeedback.  Cognitive behavioral therapy. Follow these  instructions at home: Medicines  Take over-the-counter and prescription medicines only as told by your health care provider.  Ask your health care provider if the medicine prescribed to you: ? Requires you to avoid driving or using heavy machinery. ? Can cause constipation. You  may need to take these actions to prevent or treat constipation:  Drink enough fluid to keep your urine pale yellow.  Take over-the-counter or prescription medicines.  Eat foods that are high in fiber, such as beans, whole grains, and fresh fruits and vegetables.  Limit foods that are high in fat and processed sugars, such as fried or sweet foods. Lifestyle  Do not drink alcohol.  Do not use any products that contain nicotine or tobacco, such as cigarettes, e-cigarettes, and chewing tobacco. If you need help quitting, ask your health care provider.  Get at least 8 hours of sleep every night.  Find ways to manage stress, such as meditation, deep breathing, or yoga. General instructions      Keep a journal to find out what may trigger your migraine headaches. For example, write down: ? What you eat and drink. ? How much sleep you get. ? Any change to your diet or medicines.  If you have a migraine headache: ? Avoid things that make your symptoms worse, such as bright lights. ? It may help to lie down in a dark, quiet room. ? Do not drive or use heavy machinery. ? Ask your health care provider what activities are safe for you while you are experiencing symptoms.  Keep all follow-up visits as told by your health care provider. This is important. Contact a health care provider if:  You develop symptoms that are different or more severe than your usual migraine headache symptoms.  You have more than 15 headache days in one month. Get help right away if:  Your migraine headache becomes severe.  Your migraine headache lasts longer than 72 hours.  You have a fever.  You have a stiff neck.  You have vision loss.  Your muscles feel weak or like you cannot control them.  You start to lose your balance often.  You have trouble walking.  You faint.  You have a seizure. Summary  A migraine headache is an intense, throbbing pain on one side or both sides of the head.  Migraines may also cause other symptoms, such as nausea, vomiting, and sensitivity to light and noise.  This condition may be treated with medicines and lifestyle changes. You may also need to avoid certain things that trigger a migraine headache.  Keep a journal to find out what may trigger your migraine headaches.  Contact your health care provider if you have more than 15 headache days in a month or you develop symptoms that are different or more severe than your usual migraine headache symptoms. This information is not intended to replace advice given to you by your health care provider. Make sure you discuss any questions you have with your health care provider. Document Revised: 06/26/2018 Document Reviewed: 04/16/2018 Elsevier Patient Education  2020 [employer]  Preventive Care 60-45 Years Old, Female Preventive care refers to visits with your health care provider and lifestyle choices that can promote health and wellness. This includes:  A yearly physical exam. This may also be called an annual well check.  Regular dental visits and eye exams.  Immunizations.  Screening for certain conditions.  Healthy lifestyle choices, such as eating a healthy diet, getting regular exercise, not using drugs  or products that contain nicotine and tobacco, and limiting alcohol use. What can I expect for my preventive care visit? Physical exam Your health care provider will check your:  Height and weight. This may be used to calculate body mass index (BMI), which tells if you are at a healthy weight.  Heart rate and blood pressure.  Skin for abnormal spots. Counseling Your health care provider may ask you questions about your:  Alcohol, tobacco, and drug use.  Emotional well-being.  Home and relationship well-being.  Sexual activity.  Eating habits.  Work and work Statistician.  Method of birth control.  Menstrual cycle.  Pregnancy history. What immunizations do I  need?  Influenza (flu) vaccine  This is recommended every year. Tetanus, diphtheria, and pertussis (Tdap) vaccine  You may need a Td booster every 10 years. Varicella (chickenpox) vaccine  You may need this if you have not been vaccinated. Zoster (shingles) vaccine  You may need this after age 65. Measles, mumps, and rubella (MMR) vaccine  You may need at least one dose of MMR if you were born in 1957 or later. You may also need a second dose. Pneumococcal conjugate (PCV13) vaccine  You may need this if you have certain conditions and were not previously vaccinated. Pneumococcal polysaccharide (PPSV23) vaccine  You may need one or two doses if you smoke cigarettes or if you have certain conditions. Meningococcal conjugate (MenACWY) vaccine  You may need this if you have certain conditions. Hepatitis A vaccine  You may need this if you have certain conditions or if you travel or work in places where you may be exposed to hepatitis A. Hepatitis B vaccine  You may need this if you have certain conditions or if you travel or work in places where you may be exposed to hepatitis B. Haemophilus influenzae type b (Hib) vaccine  You may need this if you have certain conditions. Human papillomavirus (HPV) vaccine  If recommended by your health care provider, you may need three doses over 6 months. You may receive vaccines as individual doses or as more than one vaccine together in one shot (combination vaccines). Talk with your health care provider about the risks and benefits of combination vaccines. What tests do I need? Blood tests  Lipid and cholesterol levels. These may be checked every 5 years, or more frequently if you are over 3 years old.  Hepatitis C test.  Hepatitis B test. Screening  Lung cancer screening. You may have this screening every year starting at age 4 if you have a 30-pack-year history of smoking and currently smoke or have quit within the past 15  years.  Colorectal cancer screening. All adults should have this screening starting at age 20 and continuing until age 15. Your health care provider may recommend screening at age 100 if you are at increased risk. You will have tests every 1-10 years, depending on your results and the type of screening test.  Diabetes screening. This is done by checking your blood sugar (glucose) after you have not eaten for a while (fasting). You may have this done every 1-3 years.  Mammogram. This may be done every 1-2 years. Talk with your health care provider about when you should start having regular mammograms. This may depend on whether you have a family history of breast cancer.  BRCA-related cancer screening. This may be done if you have a family history of breast, ovarian, tubal, or peritoneal cancers.  Pelvic exam and Pap test. This may be  done every 3 years starting at age 30. Starting at age 60, this may be done every 5 years if you have a Pap test in combination with an HPV test. Other tests  Sexually transmitted disease (STD) testing.  Bone density scan. This is done to screen for osteoporosis. You may have this scan if you are at high risk for osteoporosis. Follow these instructions at home: Eating and drinking  Eat a diet that includes fresh fruits and vegetables, whole grains, lean protein, and low-fat dairy.  Take vitamin and mineral supplements as recommended by your health care provider.  Do not drink alcohol if: ? Your health care provider tells you not to drink. ? You are pregnant, may be pregnant, or are planning to become pregnant.  If you drink alcohol: ? Limit how much you have to 0-1 drink a day. ? Be aware of how much alcohol is in your drink. In the [place] one drink equals one 12 oz bottle of beer (355 mL), one 5 oz glass of wine (148 mL), or one 1 oz glass of hard liquor (44 mL). Lifestyle  Take daily care of your teeth and gums.  Stay active. Exercise for at least 30  minutes on 5 or more days each week.  Do not use any products that contain nicotine or tobacco, such as cigarettes, e-cigarettes, and chewing tobacco. If you need help quitting, ask your health care provider.  If you are sexually active, practice safe sex. Use a condom or other form of birth control (contraception) in order to prevent pregnancy and STIs (sexually transmitted infections).  If told by your health care provider, take low-dose aspirin daily starting at age 79. What's next?  Visit your health care provider once a year for a well check visit.  Ask your health care provider how often you should have your eyes and teeth checked.  Stay up to date on all vaccines. This information is not intended to replace advice given to you by your health care provider. Make sure you discuss any questions you have with your health care provider. Document Revised: 11/13/2017 Document Reviewed: 11/13/2017 Elsevier Patient Education  2020 [employer].

## 2019-11-01 NOTE — Progress Notes (Signed)
New Patient Office Visit  Subjective:  Patient ID: Alejandra Bowers, female    DOB: 02/21/1961  Age: 59 y.o. MRN: [id number]  CC:  Chief Complaint  Patient presents with  . New Patient (Initial Visit)    establish care/med refill    HPI Alejandra Bowers is a 59 year old with history of migraine, lichen planus pylorus and rosacea followed by dermatology, morbid obesity who presents to establish care with a PCP closer to home now that she is no longer working in [place].  She presents today without any specific concerns.   Migraine: Onset x 22 years ago and she takes sumatriptan 100 mg as needed as often as 3-4 times a month.  Sometimes she will get a migraine that lasts a couple days and then she does not have one for  4 weeks. Her headaches normally start with one nostril becoming congested, and then her eye aches and spreads over the whole head.  She does not get nauseated often, no vomiting.  No light or sound sensitivity.  She does get an ocular migraine if she is out in the sun.  Her eye doctor has examined her and found no abnormality.  She has never had an MRI or neurology consult for her headaches.  She reports they are much better than they used to be.  She denies any new or concerning symptoms.  Sumatriptan orally but within an hour.  Even if she waits too long to take it, it still resolves in an hour.    Lichen plano-pilaris  (LPP)/rosacea: Followed by Dr. Eliseo Squires dermatology diagnosed about 4 years ago.  She is treated with finasteride and topical creams and Doxy 100 mg BID for years.   BMI 30/ Obese: Weight loss 50 lbs over in the past.  She used keto diet and exercises 2 miles a day.  She has not been exercising recently secondary to the heat.  Wt Readings from Last 3 Encounters:  11/01/19 163 lb (73.9 kg)  12/01/17 190 lb (86.2 kg)  04/19/16 180 lb (81.6 kg)   Hyperlipidemia: No history of elevated lipids, and that with her keto diet her triglycerides trended down, her  HDL was elevated but her LDL is elevated.  Her family doctor said it is okay because her HDL was so high.  She did cut out fat is not eating it like she used to.  She gets labs done through her [facility] wellness plan.  Immunizations: Tetanus was given 04/06/2010 up-to-date, she never had the zoster vaccine even though it is listed in the chart.  She says her husband set her up for it but she never had it done.  She has had both Covid vaccines.  Diet:was on keto- regular low carb  diet now-taking a break  Exercise:used to walk  Colonoscopy: Hemoccult FH cc no insurance Dexa: Pap Smear: Dr. Marcelle Overlie GYN did it maybe pap all normal  Mammogram: [facility] 04/2019- normal records  LMP 2006 thermal ablation  BTL  Past Medical History:  Diagnosis Date  . Chicken pox   . Lichen plano-pilaris   . Migraines   . UTI (urinary tract infection)     Past Surgical History:  Procedure Laterality Date  . APPENDECTOMY    . TUBAL LIGATION    . uterine ablation     after her BTL     Family History  Problem Relation Age of Onset  . Rheum arthritis Mother   . Cancer Mother   . Arthritis Mother   .  Cancer Father   . Hearing loss Father   . Cancer Maternal Grandmother   . Arthritis Maternal Grandfather   . Hearing loss Maternal Grandfather   . Arthritis Paternal Grandmother   . Depression Paternal Grandmother     Social History   Socioeconomic History  . Marital status: Single    Spouse name: Not on file  . Number of children: Not on file  . Years of education: Not on file  . Highest education level: Bachelor's degree (e.g., BA, AB, BS)  Occupational History  . Not on file  Tobacco Use  . Smoking status: Never Smoker  . Smokeless tobacco: Never Used  Vaping Use  . Vaping Use: Never used  Substance and Sexual Activity  . Alcohol use: Yes    Comment: 1 beer a month  . Drug use: Never  . Sexual activity: Yes  Other Topics Concern  . 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    Social Determinants of Health   Financial Resource Strain:   . Difficulty of Paying Living Expenses:   Food Insecurity:   . Worried About Programme researcher, broadcasting/film/video in the Last Year:   . Barista in the Last Year:   Transportation Needs:   . Freight forwarder (Medical):   Marland Kitchen Lack of Transportation (Non-Medical):   Physical Activity:   . Days of Exercise per Week:   . Minutes of Exercise per Session:   Stress:   . Feeling of Stress :   Social Connections:   . Frequency of Communication with Friends and Family:   . Frequency of Social Gatherings with Friends and Family:   . Attends Religious Services:   . Active Member of Clubs or Organizations:   . Attends Banker Meetings:   Marland Kitchen Marital Status:   Intimate Partner Violence:   . Fear of Current or Ex-Partner:   . Emotionally Abused:   Marland Kitchen Physically Abused:   . Sexually Abused:     ROS Review of Systems  Constitutional: Negative for chills and fever.  HENT: Negative for congestion and sinus pain.   Eyes: Negative.   Respiratory: Negative.  Negative for cough and shortness of breath.   Cardiovascular: Negative for chest pain, palpitations and leg swelling.  Gastrointestinal: Negative.   Endocrine: Negative.   Genitourinary: Negative for difficulty urinating.  Musculoskeletal: Negative.   Allergic/Immunologic: Negative.   Neurological: Positive for headaches. Negative for dizziness, tremors, seizures, syncope, speech difficulty, weakness, light-headedness and numbness.  Hematological: Negative for adenopathy. Does not bruise/bleed easily.  Psychiatric/Behavioral: Negative.        No concerns about depression or anxiety. GAD-7:  0, PHQ-9: 1    Objective:   Today's Vitals: BP 106/70 (BP Location: Left Arm, Patient Position: Sitting, Cuff Size: Normal)   Pulse 80   Temp 97.6 F (36.4 C) (Oral)    Ht 5\' 1"  (1.549 m)   Wt 163 lb (73.9 kg)   SpO2 99%   BMI 30.80 kg/m   Physical Exam Vitals reviewed.  Constitutional:      Appearance: Normal appearance.  HENT:     Head: Normocephalic.  Eyes:     Conjunctiva/sclera: Conjunctivae normal.     Pupils: Pupils are equal, round, and reactive to light.  Cardiovascular:     Rate and Rhythm: Normal rate and regular rhythm.     Heart  sounds: Normal heart sounds.  Pulmonary:     Effort: Pulmonary effort is normal.     Breath sounds: Normal breath sounds.  Abdominal:     Palpations: Abdomen is soft.     Tenderness: There is no abdominal tenderness.  Musculoskeletal:        General: Normal range of motion.     Cervical back: Normal range of motion and neck supple.  Skin:    General: Skin is warm and dry.  Neurological:     General: No focal deficit present.     Mental Status: She is alert and oriented to person, place, and time.  Psychiatric:        Mood and Affect: Mood normal.        Behavior: Behavior normal.     Assessment & Plan:   Problem List Items Addressed This Visit      Cardiovascular and Mediastinum   Chronic migraine without aura without status migrainosus, not intractable     Other   Encounter for medical examination to establish care - Primary   Relevant Orders   CBC with Differential/Platelet (Completed)   Comprehensive metabolic panel (Completed)   BMI 30.0-30.9,adult   Relevant Orders   TSH (Completed)   Hemoglobin A1c (Completed)   Lipid panel (Completed)   Low vitamin D level   Relevant Orders   VITAMIN D 25 Hydroxy (Vit-D Deficiency, Fractures) (Completed)   Low serum vitamin B12   Relevant Orders   B12 and Folate Panel (Completed)   Need for shingles vaccine   Relevant Orders   Varicella-zoster vaccine IM (Shingrix) (Completed)   Hyperlipidemia      Outpatient Encounter Medications as of 11/01/2019  Medication Sig  . doxycycline (VIBRA-TABS) 100 MG tablet   . finasteride (PROSCAR) 5 MG  tablet TAKE 1/2 TO 1 TABLET BY MOUTH DAILY AS DIRECTED  . SUMAtriptan (IMITREX) 100 MG tablet TAKE 1 TABLET BY MOUTH AS NEEDED TWICE A DAY  . vitamin B-12 (CYANOCOBALAMIN) 100 MCG tablet Vitamin B12  . cholecalciferol (VITAMIN D) 25 MCG (1000 UNIT) tablet Vitamin D3  . [DISCONTINUED] cephALEXin (KEFLEX) 500 MG capsule Take 1 capsule (500 mg total) by mouth 2 (two) times daily.  . [DISCONTINUED] diclofenac (VOLTAREN) 75 MG EC tablet Take 1 tablet (75 mg total) by mouth 2 (two) times daily.  . [DISCONTINUED] FINACEA 15 % FOAM APPLY 1 GM ON THE SKIN TWICE A DAY  . [DISCONTINUED] meloxicam (MOBIC) 15 MG tablet   . [DISCONTINUED] zolpidem (AMBIEN) 5 MG tablet Take 5 mg by mouth at bedtime.   No facility-administered encounter medications on file as of 11/01/2019.   Please go to the lab today.  We will call you when results are in.  Please sign medical release from Dr. Valentina Lucks  Proceed with Dr. Marcelle Overlie  for her GYN care as you plan  Continue to follow with your dermatologist as needed.  Congratulations on your profound weight loss!  Continue with your healthy journey.  You were going to check on the last tetanus shot.   Begin shingles vaccine series she would like to have a today.  Follow-up: Return in about 6 months (around 05/03/2020).  This visit occurred during the SARS-CoV-2 public health emergency.  Safety protocols were in place, including screening questions prior to the visit, additional usage of staff PPE, and extensive cleaning of exam room while observing appropriate contact time as indicated for disinfecting solutions.   Amedeo Kinsman, NP

## 2019-11-03 ENCOUNTER — Encounter: Payer: Self-pay | Admitting: Nurse Practitioner

## 2019-11-03 DIAGNOSIS — Z23 Encounter for immunization: Secondary | ICD-10-CM | POA: Insufficient documentation

## 2019-11-03 DIAGNOSIS — E785 Hyperlipidemia, unspecified: Secondary | ICD-10-CM | POA: Insufficient documentation

## 2019-11-09 ENCOUNTER — Encounter: Payer: Self-pay | Admitting: Nurse Practitioner

## 2019-11-09 MED ORDER — SUMATRIPTAN SUCCINATE 100 MG PO TABS: ORAL_TABLET | ORAL | 1 refills | Status: DC

## 2020-01-04 ENCOUNTER — Other Ambulatory Visit: Payer: Self-pay

## 2020-01-04 ENCOUNTER — Ambulatory Visit (INDEPENDENT_AMBULATORY_CARE_PROVIDER_SITE_OTHER): Payer: PRIVATE HEALTH INSURANCE

## 2020-01-04 DIAGNOSIS — Z23 Encounter for immunization: Secondary | ICD-10-CM | POA: Diagnosis not present

## 2020-01-04 NOTE — Progress Notes (Signed)
Patient presented for Shingrix injection to left deltoid, patient voiced no concerns nor showed any signs of distress during injection. 

## 2020-01-18 ENCOUNTER — Other Ambulatory Visit: Payer: Self-pay | Admitting: Nurse Practitioner

## 2020-01-18 DIAGNOSIS — G43709 Chronic migraine without aura, not intractable, without status migrainosus: Secondary | ICD-10-CM

## 2020-01-18 DIAGNOSIS — G44019 Episodic cluster headache, not intractable: Secondary | ICD-10-CM

## 2020-01-18 NOTE — Telephone Encounter (Signed)
Pt needs a refill on SUMAtriptan (IMITREX) 100 MG tablet sent  to [facility] [address] This needs to be wrote for quantity of 9 for 30 days for insurance to cover

## 2020-01-19 MED ORDER — SUMATRIPTAN SUCCINATE 100 MG PO TABS: ORAL_TABLET | ORAL | 1 refills | Status: AC

## 2020-01-19 NOTE — Addendum Note (Signed)
Addended by: Amedeo Kinsman A on: 01/19/2020 02:57 PM   Modules accepted: Orders

## 2020-01-19 NOTE — Telephone Encounter (Signed)
I recommend a Neurology referral to Dr. Sherryll Burger  if she agrees.    I will refill her Imitrex. She has what sounds like cluster HA. He will order different medication for her.  If she agrees, I will order a brain MRI while we wait for the Neurology consult.

## 2020-01-19 NOTE — Telephone Encounter (Signed)
Patient aware rx was sent. Patient states she has already been dx with cluster headaches a couple years ago by Dr. Valentina Lucks. Patient has never seen neurology and is okay to see them. She does not want to do an MRI at this time due to cost.

## 2020-01-20 NOTE — Telephone Encounter (Signed)
I place NEUROLOGY referral to Dr. [facility] for cluster HA.

## 2020-01-20 NOTE — Addendum Note (Signed)
Addended by: Amedeo Kinsman A on: 01/20/2020 01:37 PM   Modules accepted: Orders

## 2020-03-16 ENCOUNTER — Encounter: Payer: Self-pay | Admitting: Gastroenterology

## 2020-04-13 ENCOUNTER — Telehealth: Payer: Self-pay | Admitting: Nurse Practitioner

## 2020-04-14 NOTE — Telephone Encounter (Signed)
err

## 2020-05-01 ENCOUNTER — Other Ambulatory Visit: Payer: Self-pay

## 2020-05-01 ENCOUNTER — Ambulatory Visit (AMBULATORY_SURGERY_CENTER): Payer: PRIVATE HEALTH INSURANCE | Admitting: *Deleted

## 2020-05-01 VITALS — Ht 61.25 in | Wt 166.0 lb

## 2020-05-01 DIAGNOSIS — Z1211 Encounter for screening for malignant neoplasm of colon: Secondary | ICD-10-CM

## 2020-05-01 NOTE — Progress Notes (Signed)
Patient's pre-visit was done today over the phone with the patient due to COVID-19 pandemic. Name,DOB and address verified. Insurance verified. Patient denies any allergies to Soy. Pt is allergic to EGGS!! Patient denies any problems with anesthesia/sedation. Patient denies taking diet pills or blood thinners. Packet of Prep instructions mailed to patient including a copy of a consent form and pre-procedure patient acknowledgement form (with envelope to mail back to us)-pt is aware.  Patient understands to call us back with any questions or concerns. COVID-19 vaccines completed x3, per patient. Patient is aware of our care-partner policy and Covid-19 safety protocol. EMMI education assigned to the patient for the procedure, sent to MyChart.

## 2020-05-03 ENCOUNTER — Ambulatory Visit: Payer: PRIVATE HEALTH INSURANCE | Admitting: Nurse Practitioner

## 2020-05-10 ENCOUNTER — Encounter: Payer: Self-pay | Admitting: Gastroenterology

## 2020-05-12 ENCOUNTER — Other Ambulatory Visit: Payer: Self-pay

## 2020-05-12 ENCOUNTER — Ambulatory Visit (AMBULATORY_SURGERY_CENTER): Payer: PRIVATE HEALTH INSURANCE | Admitting: Gastroenterology

## 2020-05-12 ENCOUNTER — Encounter: Payer: Self-pay | Admitting: Gastroenterology

## 2020-05-12 VITALS — BP 97/60 | HR 69 | Temp 97.5°F | Resp 16 | Ht 61.0 in | Wt 166.0 lb

## 2020-05-12 DIAGNOSIS — Z1211 Encounter for screening for malignant neoplasm of colon: Secondary | ICD-10-CM

## 2020-05-12 MED ORDER — SODIUM CHLORIDE 0.9 % IV SOLN: 500.0000 mL | INTRAVENOUS | Status: DC

## 2020-05-12 NOTE — Patient Instructions (Signed)
Handout given for diverticulosis.  YOU HAD AN ENDOSCOPIC PROCEDURE TODAY AT THE [facility]:   Refer to the procedure report that was given to you for any specific questions about what was found during the examination.  If the procedure report does not answer your questions, please call your gastroenterologist to clarify.  If you requested that your care partner not be given the details of your procedure findings, then the procedure report has been included in a sealed envelope for you to review at your convenience later.  YOU SHOULD EXPECT: Some feelings of bloating in the abdomen. Passage of more gas than usual.  Walking can help get rid of the air that was put into your GI tract during the procedure and reduce the bloating. If you had a lower endoscopy (such as a colonoscopy or flexible sigmoidoscopy) you may notice spotting of blood in your stool or on the toilet paper. If you underwent a bowel prep for your procedure, you may not have a normal bowel movement for a few days.  Please Note:  You might notice some irritation and congestion in your nose or some drainage.  This is from the oxygen used during your procedure.  There is no need for concern and it should clear up in a day or so.  SYMPTOMS TO REPORT IMMEDIATELY:   Following lower endoscopy (colonoscopy or flexible sigmoidoscopy):  Excessive amounts of blood in the stool  Significant tenderness or worsening of abdominal pains  Swelling of the abdomen that is new, acute  Fever of 100F or higher  For urgent or emergent issues, a gastroenterologist can be reached at any hour by calling [phone]. Do not use MyChart messaging for urgent concerns.    DIET:  We do recommend a small meal at first, but then you may proceed to your regular diet.  Drink plenty of fluids but you should avoid alcoholic beverages for 24 hours.  ACTIVITY:  You should plan to take it easy for the rest of today and you should NOT DRIVE or use  heavy machinery until tomorrow (because of the sedation medicines used during the test).    FOLLOW UP: Our staff will call the number listed on your records 48-72 hours following your procedure to check on you and address any questions or concerns that you may have regarding the information given to you following your procedure. If we do not reach you, we will leave a message.  We will attempt to reach you two times.  During this call, we will ask if you have developed any symptoms of COVID 19. If you develop any symptoms (ie: fever, flu-like symptoms, shortness of breath, cough etc.) before then, please call [phone].  If you test positive for Covid 19 in the 2 weeks post procedure, please call and report this information to us.    If any biopsies were taken you will be contacted by phone or by letter within the next 1-3 weeks.  Please call us at [phone] if you have not heard about the biopsies in 3 weeks.    SIGNATURES/CONFIDENTIALITY: You and/or your care partner have signed paperwork which will be entered into your electronic medical record.  These signatures attest to the fact that that the information above on your After Visit Summary has been reviewed and is understood.  Full responsibility of the confidentiality of this discharge information lies with you and/or your care-partner. 

## 2020-05-12 NOTE — Op Note (Signed)
[facility] Patient Name: Alejandra Bowers Procedure Date: 05/12/2020 8:34 AM MRN: [id number] Endoscopist: Rachael Fee , MD Age: 60 Referring MD:  Date of Birth: [birth year] Gender: Female Account #: [id number] Procedure:                Colonoscopy Indications:              Screening for colorectal malignant neoplasm Medicines:                Monitored Anesthesia Care Procedure:                Pre-Anesthesia Assessment:                           - Prior to the procedure, a History and Physical                            was performed, and patient medications and                            allergies were reviewed. The patient's tolerance of                            previous anesthesia was also reviewed. The risks                            and benefits of the procedure and the sedation                            options and risks were discussed with the patient.                            All questions were answered, and informed consent                            was obtained. Prior Anticoagulants: The patient has                            taken no previous anticoagulant or antiplatelet                            agents. ASA Grade Assessment: II - A patient with                            mild systemic disease. After reviewing the risks                            and benefits, the patient was deemed in                            satisfactory condition to undergo the procedure.                           After obtaining informed consent, the colonoscope  was passed under direct vision. Throughout the                            procedure, the patient's blood pressure, pulse, and                            oxygen saturations were monitored continuously. The                            Olympus [id number]) Colonoscope was                            introduced through the anus and advanced to the the                            cecum, identified by  appendiceal orifice and                            ileocecal valve. The colonoscopy was performed                            without difficulty. The patient tolerated the                            procedure well. The quality of the bowel                            preparation was good. The ileocecal valve,                            appendiceal orifice, and rectum were photographed. Scope In: 8:54:48 AM Scope Out: 9:08:00 AM Scope Withdrawal Time: 0 hours 7 minutes 50 seconds  Total Procedure Duration: 0 hours 13 minutes 12 seconds  Findings:                 A few small and large-mouthed diverticula were                            found in the left colon.                           The exam was otherwise without abnormality on                            direct and retroflexion views. Complications:            No immediate complications. Estimated blood loss:                            None. Estimated Blood Loss:     Estimated blood loss: none. Impression:               - Diverticulosis in the left colon.                           - The examination was otherwise normal  on direct                            and retroflexion views.                           - No polyps or cancers. Recommendation:           - Patient has a contact number available for                            emergencies. The signs and symptoms of potential                            delayed complications were discussed with the                            patient. Return to normal activities tomorrow.                            Written discharge instructions were provided to the                            patient.                           - Resume previous diet.                           - Continue present medications.                           - Repeat colonoscopy in 10 years for screening. Rachael Fee, MD 05/12/2020 9:09:40 AM This report has been signed electronically.

## 2020-05-12 NOTE — Progress Notes (Signed)
To PACU, VSS. Report to [email or web address] 

## 2020-05-16 ENCOUNTER — Telehealth: Payer: Self-pay

## 2020-05-16 NOTE — Telephone Encounter (Signed)
  Follow up Call-  Call back number 05/12/2020  Post procedure Call Back phone  # [phone]  Permission to leave phone message Yes  Some recent data might be hidden     Patient questions:  Do you have a fever, pain , or abdominal swelling? No. Pain Score  0 *  Have you tolerated food without any problems? Yes.    Have you been able to return to your normal activities? Yes.    Do you have any questions about your discharge instructions: Diet   No. Medications  No. Follow up visit  No.  Do you have questions or concerns about your Care? No.  Actions: * If pain score is 4 or above: No action needed, pain <4.   1. Have you developed a fever since your procedure? No   2.   Have you had an respiratory symptoms (SOB or cough) since your procedure? no  3.   Have you tested positive for COVID 19 since your procedure no   4.   Have you had any family members/close contacts diagnosed with the COVID 19 since your procedure?  no   If yes to any of these questions please route to Laverna Peace, RN and Karlton Lemon, RN

## 2021-03-16 ENCOUNTER — Other Ambulatory Visit: Payer: Self-pay

## 2021-03-16 ENCOUNTER — Ambulatory Visit
Admission: RE | Admit: 2021-03-16 | Discharge: 2021-03-16 | Disposition: A | Discharge status: Home / Self Care | Payer: PRIVATE HEALTH INSURANCE | Source: Ambulatory Visit | Attending: Emergency Medicine | Admitting: Emergency Medicine

## 2021-03-16 VITALS — BP 133/80 | HR 104 | Temp 99.2°F | Resp 18

## 2021-03-16 DIAGNOSIS — R051 Acute cough: Secondary | ICD-10-CM

## 2021-03-16 DIAGNOSIS — J0100 Acute maxillary sinusitis, unspecified: Secondary | ICD-10-CM

## 2021-03-16 MED ORDER — BENZONATATE 100 MG PO CAPS: 100.0000 mg | ORAL_CAPSULE | Freq: Three times a day (TID) | ORAL | 0 refills | Status: DC | PRN

## 2021-03-16 MED ORDER — AMOXICILLIN 875 MG PO TABS: 875.0000 mg | ORAL_TABLET | Freq: Two times a day (BID) | ORAL | 0 refills | Status: AC

## 2021-03-16 NOTE — ED Provider Notes (Signed)
Renaldo Fiddler    CSN: [id number] Arrival date & time: 03/16/21  5188      History   Chief Complaint Chief Complaint  Patient presents with   Appointment    1000   Cough    HPI Alejandra Bowers is a 60 y.o. female.  Patient presents with 10-day history of sinus congestion, sinus pressure, postnasal drip, cough.  Treatment at home with Mucinex and Tylenol sinus medication.  She denies fever, chills, rash, shortness of breath, or other symptoms.    The history is provided by the patient and medical records.   Past Medical History:  Diagnosis Date   Chicken pox    Lichen plano-pilaris    Migraines    UTI (urinary tract infection)     Patient Active Problem List   Diagnosis Date Noted   Need for shingles vaccine 11/03/2019   Hyperlipidemia 11/03/2019   Encounter for medical examination to establish care 11/01/2019   BMI 30.0-30.9,adult 11/01/2019   Chronic migraine without aura without status migrainosus, not intractable 11/01/2019   Low vitamin D level 11/01/2019   Low serum vitamin B12 11/01/2019    Past Surgical History:  Procedure Laterality Date   APPENDECTOMY     TUBAL LIGATION     uterine ablation     after her BTL     OB History   No obstetric history on file.      Home Medications    Prior to Admission medications   Medication Sig Start Date End Date Taking? Authorizing Provider  amoxicillin (AMOXIL) 875 MG tablet Take 1 tablet (875 mg total) by mouth 2 (two) times daily for 10 days. 03/16/21 03/26/21 Yes Mickie Bail, NP  benzonatate (TESSALON) 100 MG capsule Take 1 capsule (100 mg total) by mouth 3 (three) times daily as needed for cough. 03/16/21  Yes Mickie Bail, NP  cholecalciferol (VITAMIN D) 25 MCG (1000 UNIT) tablet Vitamin D3    [provider]  finasteride (PROSCAR) 5 MG tablet TAKE 1/2 TO 1 TABLET BY MOUTH DAILY AS DIRECTED 04/10/16   [provider]  SUMAtriptan (IMITREX) 100 MG tablet TAKE 1 TABLET BY MOUTH AS  NEEDED TWICE A DAY 01/19/20   Theadore Nan, NP  vitamin B-12 (CYANOCOBALAMIN) 100 MCG tablet Vitamin B12    [provider]    Family History Family History  Problem Relation Age of Onset   Rheum arthritis Mother    Cancer Mother    Arthritis Mother    Cancer Father    Hearing loss Father    Cancer Maternal Grandmother    Colon polyps Maternal Grandmother    Arthritis Maternal Grandfather    Hearing loss Maternal Grandfather    Colon polyps Maternal Grandfather    Arthritis Paternal Grandmother    Depression Paternal Grandmother    Colon cancer Neg Hx    Esophageal cancer Neg Hx    Rectal cancer Neg Hx    Stomach cancer Neg Hx     Social History Social History   Tobacco Use   Smoking status: Never   Smokeless tobacco: Never  Vaping Use   Vaping Use: Never used  Substance Use Topics   Alcohol use: Yes    Comment: rare   Drug use: Never     Allergies   Egg white (egg protein), Floxin i.v. in dextrose 5% [ofloxacin], Ketorolac tromethamine, Nitrofurantoin, Telithromycin, and Other   Review of Systems Review of Systems  Constitutional:  Negative for chills and  fever.  HENT:  Positive for congestion, postnasal drip, rhinorrhea and sinus pressure. Negative for ear pain and sore throat.   Respiratory:  Positive for cough. Negative for shortness of breath.   Cardiovascular:  Negative for chest pain and palpitations.  Gastrointestinal:  Negative for diarrhea and vomiting.  Skin:  Negative for color change and rash.  All other systems reviewed and are negative.   Physical Exam Triage Vital Signs ED Triage Vitals  Enc Vitals Group     BP 03/16/21 1017 133/80     Pulse Rate 03/16/21 1017 (!) 104     Resp 03/16/21 1017 18     Temp 03/16/21 1017 99.2 F (37.3 C)     Temp Source 03/16/21 1017 Oral     SpO2 03/16/21 1017 96 %     Weight --      Height --      Head Circumference --      Peak Flow --      Pain Score 03/16/21 1016 0     Pain Loc --       Pain Edu? --      Excl. in GC? --    No data found.  Updated Vital Signs BP 133/80 (BP Location: Right Arm)    Pulse (!) 104    Temp 99.2 F (37.3 C) (Oral)    Resp 18    SpO2 96%   Visual Acuity Right Eye Distance:   Left Eye Distance:   Bilateral Distance:    Right Eye Near:   Left Eye Near:    Bilateral Near:     Physical Exam Vitals and nursing note reviewed.  Constitutional:      General: She is not in acute distress.    Appearance: Normal appearance. She is well-developed.  HENT:     Right Ear: Tympanic membrane normal.     Left Ear: Tympanic membrane normal.     Nose: Congestion and rhinorrhea present.     Mouth/Throat:     Mouth: Mucous membranes are moist.     Pharynx: Oropharynx is clear.  Cardiovascular:     Rate and Rhythm: Normal rate and regular rhythm.     Heart sounds: Normal heart sounds.  Pulmonary:     Effort: Pulmonary effort is normal. No respiratory distress.     Breath sounds: Normal breath sounds.  Abdominal:     Palpations: Abdomen is soft.     Tenderness: There is no abdominal tenderness.  Musculoskeletal:     Cervical back: Neck supple.  Skin:    General: Skin is warm and dry.  Neurological:     Mental Status: She is alert.  Psychiatric:        Mood and Affect: Mood normal.        Behavior: Behavior normal.     UC Treatments / Results  Labs (all labs ordered are listed, but only abnormal results are displayed) Labs Reviewed - No data to display  EKG   Radiology No results found.  Procedures Procedures (including critical care time)  Medications Ordered in UC Medications - No data to display  Initial Impression / Assessment and Plan / UC Course  I have reviewed the triage vital signs and the nursing notes.  Pertinent labs & imaging results that were available during my care of the patient were reviewed by me and considered in my medical decision making (see chart for details).   Acute sinusitis, cough.  Patient has  been symptomatic for 10 days  and is not improving with OTC treatment.  Treating with amoxicillin and Tessalon Perles.  Education provided on sinusitis.  Instructed patient to follow-up with her PCP if her symptoms are not improving.  She agrees to plan of care.     Final Clinical Impressions(s) / UC Diagnoses   Final diagnoses:  Acute non-recurrent maxillary sinusitis  Acute cough     Discharge Instructions      Take the amoxicillin and Tessalon Perles as directed.  Follow up with your primary care provider if your symptoms are not improving.         ED Prescriptions     Medication Sig Dispense Auth. Provider   amoxicillin (AMOXIL) 875 MG tablet Take 1 tablet (875 mg total) by mouth 2 (two) times daily for 10 days. 20 tablet Mickie Bail, NP   benzonatate (TESSALON) 100 MG capsule Take 1 capsule (100 mg total) by mouth 3 (three) times daily as needed for cough. 21 capsule Mickie Bail, NP      PDMP not reviewed this encounter.   Mickie Bail, NP 03/16/21 1047

## 2021-03-16 NOTE — ED Triage Notes (Signed)
Pt reports cough and congestion x 10 days. Mucinex and Tylenol sinus gives some relief.

## 2021-03-16 NOTE — Discharge Instructions (Addendum)
Take the amoxicillin and Tessalon Perles as directed.    Follow up with your primary care provider if your symptoms are not improving.    

## 2021-08-09 ENCOUNTER — Ambulatory Visit
Admission: EM | Admit: 2021-08-09 | Discharge: 2021-08-09 | Disposition: A | Discharge status: Home / Self Care | Payer: PRIVATE HEALTH INSURANCE | Attending: Student | Admitting: Student

## 2021-08-09 DIAGNOSIS — N3001 Acute cystitis with hematuria: Secondary | ICD-10-CM | POA: Diagnosis present

## 2021-08-09 DIAGNOSIS — Z87440 Personal history of urinary (tract) infections: Secondary | ICD-10-CM | POA: Insufficient documentation

## 2021-08-09 LAB — POCT URINALYSIS DIP (MANUAL ENTRY)
Bilirubin, UA: NEGATIVE
Glucose, UA: NEGATIVE mg/dL
Nitrite, UA: POSITIVE — AB
Protein Ur, POC: NEGATIVE mg/dL
Spec Grav, UA: 1.01 (ref 1.010–1.025)
Urobilinogen, UA: 0.2 E.U./dL
pH, UA: 5.5 (ref 5.0–8.0)

## 2021-08-09 MED ORDER — CIPROFLOXACIN HCL 500 MG PO TABS: 500.0000 mg | ORAL_TABLET | Freq: Two times a day (BID) | ORAL | 0 refills | Status: AC

## 2021-08-09 NOTE — Discharge Instructions (Addendum)
-  Ciprofloxacin twice daily x7 days -Drink plenty of fluids -Azo for symptomatic relief -Follow-up with [place] or PCP if symptoms worsen/persist

## 2021-08-09 NOTE — ED Provider Notes (Signed)
[facility]    CSN: [id number] Arrival date & time: 08/09/21  1328      History   Chief Complaint Chief Complaint  Patient presents with   Urinary Frequency    Uti - Entered by patient    HPI Alejandra Bowers is a 61 y.o. female presenting with urinary symptoms x2 days. History UTI. Per pt - she had a UTI 1 month ago, had a televisit and bactrim was sent. Resolution x1 month. States she had some diarrhea 2 days ago and following this developed recurrent urinary frequency. (Notes she is wiping front to back and is very hygenic) Has been taking azo with some relief. Denies hematuria, dysuria, back pain, n/v/d/abd pain, fevers/chills, abdnormal vaginal discharge.    HPI  Past Medical History:  Diagnosis Date   Chicken pox    Lichen plano-pilaris    Migraines    UTI (urinary tract infection)     Patient Active Problem List   Diagnosis Date Noted   Need for shingles vaccine 11/03/2019   Hyperlipidemia 11/03/2019   Encounter for medical examination to establish care 11/01/2019   BMI 30.0-30.9,adult 11/01/2019   Chronic migraine without aura without status migrainosus, not intractable 11/01/2019   Low vitamin D level 11/01/2019   Low serum vitamin B12 11/01/2019    Past Surgical History:  Procedure Laterality Date   APPENDECTOMY     TUBAL LIGATION     uterine ablation     after her BTL     OB History   No obstetric history on file.      Home Medications    Prior to Admission medications   Medication Sig Start Date End Date Taking? Authorizing Provider  cholecalciferol (VITAMIN D) 25 MCG (1000 UNIT) tablet Vitamin D3    [provider]  ciprofloxacin (CIPRO) 500 MG tablet Take 1 tablet (500 mg total) by mouth 2 (two) times daily for 7 days. 08/09/21 08/16/21 Yes Rhys MartiniGraham, Kell Ferris E, PA-C  finasteride (PROSCAR) 5 MG tablet TAKE 1/2 TO 1 TABLET BY MOUTH DAILY AS DIRECTED 04/10/16   [provider]  SUMAtriptan (IMITREX) 100 MG tablet TAKE 1 TABLET  BY MOUTH AS NEEDED TWICE A DAY 01/19/20   Theadore NanMills, Kimberly A, NP  vitamin B-12 (CYANOCOBALAMIN) 100 MCG tablet Vitamin B12    [provider]    Family History Family History  Problem Relation Age of Onset   Rheum arthritis Mother    Cancer Mother    Arthritis Mother    Cancer Father    Hearing loss Father    Cancer Maternal Grandmother    Colon polyps Maternal Grandmother    Arthritis Maternal Grandfather    Hearing loss Maternal Grandfather    Colon polyps Maternal Grandfather    Arthritis Paternal Grandmother    Depression Paternal Grandmother    Colon cancer Neg Hx    Esophageal cancer Neg Hx    Rectal cancer Neg Hx    Stomach cancer Neg Hx     Social History Social History   Tobacco Use   Smoking status: Never   Smokeless tobacco: Never  Vaping Use   Vaping Use: Never used  Substance Use Topics   Alcohol use: Yes    Comment: rare   Drug use: Never     Allergies   Egg white (egg protein), Floxin i.v. in dextrose 5% [ofloxacin], Ketorolac tromethamine, Nitrofurantoin, Telithromycin, and Other   Review of Systems Review of Systems  Constitutional:  Negative for chills and fever.  HENT:  Negative for sore throat.   Eyes:  Negative for pain and redness.  Respiratory:  Negative for shortness of breath.   Cardiovascular:  Negative for chest pain.  Gastrointestinal:  Negative for abdominal pain, diarrhea, nausea and vomiting.  Genitourinary:  Positive for frequency. Negative for decreased urine volume, difficulty urinating, dysuria, flank pain, genital sores, hematuria, urgency, vaginal bleeding, vaginal discharge and vaginal pain.  Musculoskeletal:  Negative for back pain.  Skin:  Negative for rash.  All other systems reviewed and are negative.   Physical Exam Triage Vital Signs ED Triage Vitals [08/09/21 1343]  Enc Vitals Group     BP (!) 131/98     Pulse Rate 81     Resp 16     Temp 97.7 F (36.5 C)     Temp Source Temporal     SpO2 99 %      Weight      Height      Head Circumference      Peak Flow      Pain Score 0     Pain Loc      Pain Edu?      Excl. in GC?    No data found.  Updated Vital Signs BP (!) 131/98 (BP Location: Left Arm)   Pulse 81   Temp 97.7 F (36.5 C) (Temporal)   Resp 16   SpO2 99%   Visual Acuity Right Eye Distance:   Left Eye Distance:   Bilateral Distance:    Right Eye Near:   Left Eye Near:    Bilateral Near:     Physical Exam Vitals reviewed.  Constitutional:      General: She is not in acute distress.    Appearance: Normal appearance. She is not ill-appearing.  HENT:     Head: Normocephalic and atraumatic.     Mouth/Throat:     Mouth: Mucous membranes are moist.     Comments: Moist mucous membranes Eyes:     Extraocular Movements: Extraocular movements intact.     Pupils: Pupils are equal, round, and reactive to light.  Cardiovascular:     Rate and Rhythm: Normal rate and regular rhythm.     Heart sounds: Normal heart sounds.  Pulmonary:     Effort: Pulmonary effort is normal.     Breath sounds: Normal breath sounds. No wheezing, rhonchi or rales.  Abdominal:     General: Bowel sounds are normal. There is no distension.     Palpations: Abdomen is soft. There is no mass.     Tenderness: There is no abdominal tenderness. There is no right CVA tenderness, left CVA tenderness, guarding or rebound.  Skin:    General: Skin is warm.     Capillary Refill: Capillary refill takes less than 2 seconds.     Comments: Good skin turgor  Neurological:     General: No focal deficit present.     Mental Status: She is alert and oriented to person, place, and time.  Psychiatric:        Mood and Affect: Mood normal.        Behavior: Behavior normal.     UC Treatments / Results  Labs (all labs ordered are listed, but only abnormal results are displayed) Labs Reviewed  POCT URINALYSIS DIP (MANUAL ENTRY) - Abnormal; Notable for the following components:      Result Value   Ketones,  POC UA moderate (40) (*)    Blood, UA large (*)  Nitrite, UA Positive (*)    Leukocytes, UA Large (3+) (*)    All other components within normal limits  URINE CULTURE    EKG   Radiology No results found.  Procedures Procedures (including critical care time)  Medications Ordered in UC Medications - No data to display  Initial Impression / Assessment and Plan / UC Course  I have reviewed the triage vital signs and the nursing notes.  Pertinent labs & imaging results that were available during my care of the patient were reviewed by me and considered in my medical decision making (see chart for details).     This patient is a very pleasant 61 y.o. year old female presenting with recurrent cystitis. Afebrile, nontachycardic, no reproducible abd pain or CVAT.  UA diffusely positive given Azo. Culture sent.  Was treated for UTI with bactrim 1 month ago via televisit. She is penicillin and macrobid allergic. Has tolerated ciprofloxacin in the past. Sent.   ED return precautions discussed. Patient verbalizes understanding and agreement.    Final Clinical Impressions(s) / UC Diagnoses   Final diagnoses:  Acute cystitis with hematuria     Discharge Instructions      -Ciprofloxacin twice daily x7 days -Drink plenty of fluids -Azo for symptomatic relief -Follow-up with [place] or PCP if symptoms worsen/persist    ED Prescriptions     Medication Sig Dispense Auth. Provider   ciprofloxacin (CIPRO) 500 MG tablet Take 1 tablet (500 mg total) by mouth 2 (two) times daily for 7 days. 14 tablet Rhys Martini, PA-C      PDMP not reviewed this encounter.   Rhys Martini, PA-C 08/09/21 1417

## 2021-08-09 NOTE — ED Triage Notes (Signed)
Patient presents to Urgent Care with complaints of urinary freq since today. Treated a UTI a month ago, treated with antibiotic. Unsure if first one cleared up. Self - treated with azo.

## 2021-08-10 LAB — URINE CULTURE: Culture: <10000 — AB

## 2021-12-25 ENCOUNTER — Ambulatory Visit: Payer: Self-pay

## 2023-12-31 ENCOUNTER — Ambulatory Visit: Payer: Self-pay | Admitting: Podiatry

## 2024-01-14 ENCOUNTER — Ambulatory Visit: Payer: Self-pay | Admitting: Podiatry

## 2024-01-14 ENCOUNTER — Ambulatory Visit (INDEPENDENT_AMBULATORY_CARE_PROVIDER_SITE_OTHER)

## 2024-01-14 DIAGNOSIS — G5760 Lesion of plantar nerve, unspecified lower limb: Secondary | ICD-10-CM | POA: Diagnosis not present

## 2024-01-14 DIAGNOSIS — M722 Plantar fascial fibromatosis: Secondary | ICD-10-CM | POA: Diagnosis not present

## 2024-01-14 MED ORDER — TRIAMCINOLONE ACETONIDE 40 MG/ML IJ SUSP
20.0000 mg | Freq: Once | INTRAMUSCULAR | Status: AC
  Administered 2024-01-14: 20 mg

## 2024-01-14 NOTE — Progress Notes (Signed)
 She presents today chief complaint of pain between her 2nd and 3rd digits of her left foot.  She states that has been bothering me for quite a while now.  She is it feels like electrical shocks that run out my toes.  Objective: Vital signs are stable alert and oriented x 3.  Pulses are palpable.  There is no erythema edema cellulitis drainage or odor neurologic sensorium demonstrates palpable Mulder's click to the second interdigital space left foot.  Radiographs taken today demonstrate osseously mature foot mild to moderate osteopenia.  No fractures of the forefoot.  No acute findings.  Possible mild plantar fasciitis.  Assessment: Neuroma second interdigital space left foot.  Plan: I injected the second interdigital space of the left foot with 20 mg Kenalog  5 mg Marcaine plain maximal tenderness tolerated procedure well without complications will follow-up with her on as-needed basis.
